# Patient Record
Sex: Female | Born: 1985 | Race: Black or African American | Hispanic: No | Marital: Married | Smoking: Never smoker
Health system: Southern US, Community
[De-identification: ages and names within clinical notes are randomized; demographics above are authoritative.]

## PROBLEM LIST (undated history)

## (undated) ENCOUNTER — Inpatient Hospital Stay (HOSPITAL_COMMUNITY): Payer: Self-pay

## (undated) DIAGNOSIS — Z789 Other specified health status: Secondary | ICD-10-CM

## (undated) HISTORY — PX: NO PAST SURGERIES: SHX2092

---
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
## 2008-04-27 ENCOUNTER — Emergency Department (HOSPITAL_COMMUNITY): Admission: EM | Admit: 2008-04-27 | Discharge: 2008-04-27 | Payer: Self-pay | Admitting: Emergency Medicine

## 2016-09-30 ENCOUNTER — Emergency Department (HOSPITAL_COMMUNITY): Payer: BC Managed Care – PPO

## 2016-09-30 ENCOUNTER — Encounter (HOSPITAL_COMMUNITY): Payer: Self-pay | Admitting: Emergency Medicine

## 2016-09-30 ENCOUNTER — Emergency Department (HOSPITAL_COMMUNITY)
Admission: EM | Admit: 2016-09-30 | Discharge: 2016-09-30 | Disposition: A | Discharge status: Home / Self Care | Payer: BC Managed Care – PPO | Attending: Emergency Medicine | Admitting: Emergency Medicine

## 2016-09-30 DIAGNOSIS — E876 Hypokalemia: Secondary | ICD-10-CM | POA: Diagnosis not present

## 2016-09-30 DIAGNOSIS — R112 Nausea with vomiting, unspecified: Secondary | ICD-10-CM

## 2016-09-30 DIAGNOSIS — K2970 Gastritis, unspecified, without bleeding: Secondary | ICD-10-CM | POA: Diagnosis not present

## 2016-09-30 DIAGNOSIS — R1033 Periumbilical pain: Secondary | ICD-10-CM | POA: Diagnosis present

## 2016-09-30 DIAGNOSIS — R740 Nonspecific elevation of levels of transaminase and lactic acid dehydrogenase [LDH]: Secondary | ICD-10-CM | POA: Diagnosis not present

## 2016-09-30 DIAGNOSIS — R1011 Right upper quadrant pain: Secondary | ICD-10-CM

## 2016-09-30 DIAGNOSIS — Z79899 Other long term (current) drug therapy: Secondary | ICD-10-CM | POA: Diagnosis not present

## 2016-09-30 DIAGNOSIS — R7401 Elevation of levels of liver transaminase levels: Secondary | ICD-10-CM

## 2016-09-30 LAB — URINALYSIS, ROUTINE W REFLEX MICROSCOPIC
BILIRUBIN URINE: NEGATIVE
Glucose, UA: NEGATIVE mg/dL
Hgb urine dipstick: NEGATIVE
Ketones, ur: NEGATIVE mg/dL
Leukocytes, UA: NEGATIVE
NITRITE: NEGATIVE
PH: 5 (ref 5.0–8.0)
Protein, ur: NEGATIVE mg/dL
SPECIFIC GRAVITY, URINE: 1.02 (ref 1.005–1.030)

## 2016-09-30 LAB — CBC WITH DIFFERENTIAL/PLATELET
Basophils Absolute: 0 10*3/uL (ref 0.0–0.1)
Basophils Relative: 0 %
Eosinophils Absolute: 0.1 10*3/uL (ref 0.0–0.7)
Eosinophils Relative: 1 %
HEMATOCRIT: 36.8 % (ref 36.0–46.0)
HEMOGLOBIN: 12.3 g/dL (ref 12.0–15.0)
LYMPHS ABS: 0.4 10*3/uL — AB (ref 0.7–4.0)
LYMPHS PCT: 6 %
MCH: 28.4 pg (ref 26.0–34.0)
MCHC: 33.4 g/dL (ref 30.0–36.0)
MCV: 85 fL (ref 78.0–100.0)
MONOS PCT: 5 %
Monocytes Absolute: 0.4 10*3/uL (ref 0.1–1.0)
NEUTROS ABS: 6.7 10*3/uL (ref 1.7–7.7)
NEUTROS PCT: 88 %
Platelets: 165 10*3/uL (ref 150–400)
RBC: 4.33 MIL/uL (ref 3.87–5.11)
RDW: 12.5 % (ref 11.5–15.5)
WBC: 7.6 10*3/uL (ref 4.0–10.5)

## 2016-09-30 LAB — COMPREHENSIVE METABOLIC PANEL
ALT: 23 U/L (ref 14–54)
AST: 44 U/L — AB (ref 15–41)
Albumin: 4 g/dL (ref 3.5–5.0)
Alkaline Phosphatase: 48 U/L (ref 38–126)
Anion gap: 6 (ref 5–15)
BUN: 10 mg/dL (ref 6–20)
CHLORIDE: 104 mmol/L (ref 101–111)
CO2: 26 mmol/L (ref 22–32)
CREATININE: 0.7 mg/dL (ref 0.44–1.00)
Calcium: 8.3 mg/dL — ABNORMAL LOW (ref 8.9–10.3)
GFR calc non Af Amer: >60 mL/min (ref >60)
Glucose, Bld: 107 mg/dL — ABNORMAL HIGH (ref 65–99)
Potassium: 3.3 mmol/L — ABNORMAL LOW (ref 3.5–5.1)
SODIUM: 136 mmol/L (ref 135–145)
Total Bilirubin: 0.9 mg/dL (ref 0.3–1.2)
Total Protein: 7.2 g/dL (ref 6.5–8.1)

## 2016-09-30 LAB — LIPASE, BLOOD: LIPASE: 16 U/L (ref 11–51)

## 2016-09-30 LAB — I-STAT CG4 LACTIC ACID, ED: Lactic Acid, Venous: 0.83 mmol/L (ref 0.5–1.9)

## 2016-09-30 MED ORDER — SODIUM CHLORIDE 0.9 % IV BOLUS (SEPSIS)
1000.0000 mL | Freq: Once | INTRAVENOUS | Status: AC
  Administered 2016-09-30: 1000 mL via INTRAVENOUS

## 2016-09-30 MED ORDER — FAMOTIDINE IN NACL 20-0.9 MG/50ML-% IV SOLN
20.0000 mg | Freq: Once | INTRAVENOUS | Status: AC
  Administered 2016-09-30: 20 mg via INTRAVENOUS
  Filled 2016-09-30: qty 50

## 2016-09-30 MED ORDER — RANITIDINE HCL 150 MG PO TABS: 150.0000 mg | ORAL_TABLET | Freq: Two times a day (BID) | ORAL | 0 refills | Status: DC

## 2016-09-30 MED ORDER — OXYCODONE-ACETAMINOPHEN 5-325 MG PO TABS
1.0000 | ORAL_TABLET | ORAL | Status: DC | PRN
  Administered 2016-09-30: 1 via ORAL
  Filled 2016-09-30: qty 1

## 2016-09-30 MED ORDER — ONDANSETRON 8 MG PO TBDP
8.0000 mg | ORAL_TABLET | Freq: Once | ORAL | Status: AC
  Administered 2016-09-30: 8 mg via ORAL
  Filled 2016-09-30: qty 1

## 2016-09-30 MED ORDER — ONDANSETRON 4 MG PO TBDP: 4.0000 mg | ORAL_TABLET | Freq: Three times a day (TID) | ORAL | 0 refills | Status: DC | PRN

## 2016-09-30 MED ORDER — POTASSIUM CHLORIDE CRYS ER 20 MEQ PO TBCR
40.0000 meq | EXTENDED_RELEASE_TABLET | Freq: Once | ORAL | Status: AC
  Administered 2016-09-30: 40 meq via ORAL
  Filled 2016-09-30: qty 2

## 2016-09-30 MED ORDER — MORPHINE SULFATE (PF) 4 MG/ML IV SOLN
4.0000 mg | Freq: Once | INTRAVENOUS | Status: AC
  Administered 2016-09-30: 4 mg via INTRAVENOUS
  Filled 2016-09-30: qty 1

## 2016-09-30 NOTE — ED Notes (Signed)
US at bedside

## 2016-09-30 NOTE — ED Notes (Signed)
Pt given urine cup and aware urine sample is needed 

## 2016-09-30 NOTE — Discharge Instructions (Signed)
Your abdominal pain is likely from gastritis or an ulcer, or just from stomach irritation from your antibiotics. Start taking zantac as directed, and avoid spicy/fatty/acidic foods, avoid soda/coffee/tea/alcohol. Avoid laying down flat within 30 minutes of eating. Avoid NSAIDs like ibuprofen/aleve/motrin/etc on an empty stomach. May consider using over the counter tums/maalox as needed for additional relief. Use zofran as directed as needed for nausea. Use tylenol as needed for pain. Follow up with your regular doctor in one week for recheck of symptoms. Return to the ER for changes or worsening symptoms.  Abdominal (belly) pain can be caused by many things. Your caregiver performed an examination and possibly ordered blood/urine tests and imaging (CT scan, x-rays, ultrasound). Many cases can be observed and treated at home after initial evaluation in the emergency department. Even though you are being discharged home, abdominal pain can be unpredictable. Therefore, you need a repeated exam if your pain does not resolve, returns, or worsens. Most patients with abdominal pain don't have to be admitted to the hospital or have surgery, but serious problems like appendicitis and gallbladder attacks can start out as nonspecific pain. Many abdominal conditions cannot be diagnosed in one visit, so follow-up evaluations are very important. SEEK IMMEDIATE MEDICAL ATTENTION IF YOU DEVELOP ANY OF THE FOLLOWING SYMPTOMS: The pain does not go away or becomes severe.  A temperature above 101 develops.  Repeated vomiting occurs (multiple episodes).  The pain becomes localized to portions of the abdomen. The right side could possibly be appendicitis. In an adult, the left lower portion of the abdomen could be colitis or diverticulitis.  Blood is being passed in stools or vomit (bright red or black tarry stools).  Return also if you develop chest pain, difficulty breathing, dizziness or fainting, or become confused, poorly  responsive, or inconsolable (young children). The constipation stays for more than 4 days.  There is belly (abdominal) or rectal pain.  You do not seem to be getting better.

## 2016-09-30 NOTE — ED Notes (Signed)
EDP at bedside  

## 2016-09-30 NOTE — ED Triage Notes (Signed)
Patient is complaining of central abdominal pain starting 1 hour ago and emesis yesterday. Denies n/v/d today.

## 2016-09-30 NOTE — ED Provider Notes (Signed)
[facility] DEPT Provider Note   CSN: [id number] Arrival date & time: 09/30/16  1501     History   Chief Complaint Chief Complaint  Patient presents with  . Abdominal Pain    HPI Dana Werner is a 31 y.o. Otherwise healthy female with no known PMHx, who presents to the ED with complaints of sudden onset periumbilical pain that began approximately 1 hour prior to arrival after she awoke from a nap. She describes the pain is 9/10 constant waxing and waning periumbilical stabbing pain that occasionally radiates to the epigastric area, worse with sitting up, improved with laying down, and with no other treatments tried prior to arrival. She states that 3 days ago she was diagnosed with strep, and she is currently on an antibiotic but she cannot recall the name of the antibiotic. She reports that yesterday she was outside in the heat and had 2 episodes of nonbloody nonbilious emesis associated with some nausea, but afterward she felt fine and was able to eat dinner without any difficulty, had no recurrent vomiting or nausea. Today she has not had any nausea or recurrent episodes of emesis. She used ibuprofen yesterday she does not chronically use NSAIDs. She does not have menstrual cycle because she has an IUD in place. She ate chicken soup at around 12:30 PM and that is the only thing she consumed today. Her PCP [facility]. She is sexually active with 1 female partner, protected with condoms. She denies fevers, chills, CP, SOB, recurrent/ongoing nausea/vomiting, diarrhea/constipation, obstipation, melena, hematochezia, hematemesis, hematuria, dysuria, flank pain, vaginal bleeding/discharge, myalgias, arthralgias, numbness, tingling, focal weakness, or any other complaints at this time. Denies recent travel, sick contacts, suspicious food intake, EtOH use, or prior abd surgeries.    The history is provided by the patient and medical records. No language interpreter was  used.  Abdominal Pain   This is a new problem. The current episode started 1 to 2 hours ago. The problem occurs constantly. The problem has not changed since onset.The pain is associated with an unknown factor. The pain is located in the periumbilical region. The quality of the pain is sharp (stabbing). The pain is at a severity of 9/10. The pain is moderate. Associated symptoms include vomiting (2x yesterday, then felt fine and able to eat, no ongoing vomiting). Pertinent negatives include fever, diarrhea, flatus, hematochezia, melena, nausea (none ongoing, one episode yesterday and then resolved entirely), constipation, dysuria, hematuria, arthralgias and myalgias. The symptoms are aggravated by certain positions. The symptoms are relieved by recumbency.    History reviewed. No pertinent past medical history.  There are no active problems to display for this patient.   History reviewed. No pertinent surgical history.  OB History    No data available       Home Medications    Prior to Admission medications   Medication Sig Start Date End Date Taking? Authorizing Provider  levonorgestrel (MIRENA) 20 MCG/24HR IUD 1 each by Intrauterine route once.   Yes Historical Provider, MD    Family History No family history on file.  Social History Social History  Substance Use Topics  . Smoking status: Never Smoker  . Smokeless tobacco: Never Used  . Alcohol use No     Allergies   Patient has no known allergies.   Review of Systems Review of Systems  Constitutional: Negative for chills and fever.  Respiratory: Negative for shortness of breath.   Cardiovascular: Negative for chest pain.  Gastrointestinal:  Positive for abdominal pain and vomiting (2x yesterday, then felt fine and able to eat, no ongoing vomiting). Negative for blood in stool, constipation, diarrhea, flatus, hematochezia, melena and nausea (none ongoing, one episode yesterday and then resolved entirely).    Genitourinary: Negative for dysuria, flank pain, hematuria, vaginal bleeding and vaginal discharge.  Musculoskeletal: Negative for arthralgias and myalgias.  Skin: Negative for color change.  Allergic/Immunologic: Negative for immunocompromised state.  Neurological: Negative for weakness and numbness.  Psychiatric/Behavioral: Negative for confusion.   All other systems reviewed and are negative for acute change except as noted in the HPI.    Physical Exam Updated Vital Signs BP (!) 124/94 (BP Location: Left Arm)   Pulse (!) 119   Temp 98.2 F (36.8 C) (Oral)   Resp 16   Ht 5' 4" (1.626 m)   Wt 56.7 kg   SpO2 100%   BMI 21.46 kg/m   Physical Exam  Constitutional: She is oriented to person, place, and time. Vital signs are normal. She appears well-developed and well-nourished.  Non-toxic appearance. No distress.  Afebrile, nontoxic, NAD  HENT:  Head: Normocephalic and atraumatic.  Mouth/Throat: Oropharynx is clear and moist. Mucous membranes are dry.  Mildly dry lips  Eyes: Conjunctivae and EOM are normal. Right eye exhibits no discharge. Left eye exhibits no discharge.  Neck: Normal range of motion. Neck supple.  Cardiovascular: Regular rhythm, normal heart sounds and intact distal pulses.  Tachycardia present.  Exam reveals no gallop and no friction rub.   No murmur heard. Mildly tachycardic likely from dehydration  Pulmonary/Chest: Effort normal and breath sounds normal. No respiratory distress. She has no decreased breath sounds. She has no wheezes. She has no rhonchi. She has no rales.  Abdominal: Soft. Normal appearance and bowel sounds are normal. She exhibits no distension. There is tenderness in the right upper quadrant and epigastric area. There is positive Murphy's sign. There is no rigidity, no rebound, no guarding, no CVA tenderness and no tenderness at McBurney's point.  Soft, nondistended, +BS throughout, with moderate RUQ and epigastric TTP, some milder RLQ  discomfort on deep palpation but pt denies that it's painful or really tender at all, no r/g/r, +murphy's, neg mcburney's, no CVA TTP, neg psoas sign and foot tap test  Musculoskeletal: Normal range of motion.  Neurological: She is alert and oriented to person, place, and time. She has normal strength. No sensory deficit.  Skin: Skin is warm, dry and intact. No rash noted.  Psychiatric: She has a normal mood and affect.  Nursing note and vitals reviewed.    ED Treatments / Results  Labs (all labs ordered are listed, but only abnormal results are displayed) Labs Reviewed  COMPREHENSIVE METABOLIC PANEL - Abnormal; Notable for the following:       Result Value   Potassium 3.3 (*)    Glucose, Bld 107 (*)    Calcium 8.3 (*)    AST 44 (*)    All other components within normal limits  CBC WITH DIFFERENTIAL/PLATELET - Abnormal; Notable for the following:    Lymphs Abs 0.4 (*)    All other components within normal limits  LIPASE, BLOOD  URINALYSIS, ROUTINE W REFLEX MICROSCOPIC  I-STAT BETA HCG BLOOD, ED ([facility], [facility], [facility] ONLY)  I-STAT CG4 LACTIC ACID, ED    EKG  EKG Interpretation None       Radiology US Abdomen Complete  Result Date: 09/30/2016 CLINICAL DATA:  Right upper quadrant pain EXAM: ABDOMEN ULTRASOUND COMPLETE COMPARISON:  None.  FINDINGS: Gallbladder: No gallstones or wall thickening visualized. No sonographic Murphy sign noted by sonographer. Common bile duct: Diameter: 3.4 mm Liver: No focal lesion identified. Within normal limits in parenchymal echogenicity. IVC: No abnormality visualized. Pancreas: Visualized portion unremarkable. Spleen: Size and appearance within normal limits. Right Kidney: Length: 10.1 cm. Echogenicity within normal limits. No mass or hydronephrosis visualized. Left Kidney: Length: 10.1 cm. Echogenicity within normal limits. No mass or hydronephrosis visualized. Abdominal aorta: No aneurysm visualized. Other findings: None. IMPRESSION: Negative abdominal  ultrasound. Electronically Signed   By: Franchot Gallo M.D.   On: 09/30/2016 17:50    Procedures Procedures (including critical care time)  Medications Ordered in ED Medications  oxyCODONE-acetaminophen (PERCOCET/ROXICET) 5-325 MG per tablet 1 tablet (1 tablet Oral Given 09/30/16 1514)  ondansetron (ZOFRAN-ODT) disintegrating tablet 8 mg (not administered)  sodium chloride 0.9 % bolus 1,000 mL (0 mLs Intravenous Stopped 09/30/16 1729)  morphine 4 MG/ML injection 4 mg (4 mg Intravenous Given 09/30/16 1602)  famotidine (PEPCID) IVPB 20 mg premix (0 mg Intravenous Stopped 09/30/16 1729)  potassium chloride SA (K-DUR,KLOR-CON) CR tablet 40 mEq (40 mEq Oral Given 09/30/16 1822)     Initial Impression / Assessment and Plan / ED Course  I have reviewed the triage vital signs and the nursing notes.  Pertinent labs & imaging results that were available during my care of the patient were reviewed by me and considered in my medical decision making (see chart for details).     31 y.o. female here with sudden onset periumbilical abd TTP, yesterday had 2 episodes of emesis but then ate well last night and felt fine, no ongoing n/v. Thinks n/v yesterday was because she was outside in the heat. On exam, mildly tachycardic, dry lips, moderate RUQ and epigastric TTP with +murphy's sign, some milder discomfort in RLQ but multiple repeat palpations did not reveal very significant tenderness there, neg foot tap test and psoas sign, nonperitoneal exam. Recent dx of strep, taking abx (unknown name) for this. Will get labs and U/S; considered CT over U/S but since pain is mostly in RUQ and +murphys, would rather go with U/S for better evaluation of the gallbladder/biliary tree; if RLQ tenderness becomes more intense and labs show leukocytosis, may need to consider getting CT, but will hold off for now. Doubt need for pelvic exam given lack of lower abd tenderness or vaginal complaints. Will give pain meds, fluids, and pepcid  then reassess shortly.   6:26 PM Pt feeling much better. Repeat abd exam reveals no ongoing abd tenderness, and no RLQ discomfort/tenderness at all. CBC w/diff unremarkable, which is reassuring. Unclear why she had a lactic done when this was not ordered, but lactic acid also WNL. CMP with mildly low K 3.3, will replete orally here; also with mildly elevated AST 44, no ALT or alk phos changes, bili WNL. Lipase WNL. BetaHCG not yet done, will have this done now. U/S completely unremarkable. Tachycardia improved with fluids. U/A not yet collected, will have this done now. Will reassess after U/A results.   8:31 PM Pt feeling well, mildly nauseated, will give ODT zofran before discharge. U/A unremarkable. BetaHCG neg (not crossing over, but confirmed that it is <5.0). Pt continues to feel improved. Symptoms likely related to gastritis or gastrointestinal irritation from strep throat/abx/NSAID use. Advised use of zantac, will rx zofran in case she gets nauseated again. Discussed lifestyle and diet modifications for gastritis. Tylenol PRN pain. F/up with PCP in 1wk for recheck of symptoms. I explained  the diagnosis and have given explicit precautions to return to the ER including for any other new or worsening symptoms. The patient understands and accepts the medical plan as it's been dictated and I have answered their questions. Discharge instructions concerning home care and prescriptions have been given. The patient is STABLE and is discharged to home in good condition.    Final Clinical Impressions(s) / ED Diagnoses   Final diagnoses:  RUQ abdominal pain  Hypokalemia  Elevated AST (SGOT)  Gastritis, presence of bleeding unspecified, unspecified chronicity, unspecified gastritis type  Nausea and vomiting in adult patient    New Prescriptions New Prescriptions   ONDANSETRON (ZOFRAN ODT) 4 MG DISINTEGRATING TABLET    Take 1 tablet (4 mg total) by mouth every 8 (eight) hours as needed for nausea or  vomiting.   RANITIDINE (ZANTAC) 150 MG TABLET    Take 1 tablet (150 mg total) by mouth 2 (two) times daily.     [address], PA-C 09/30/16 2032    Rex Kras Wenda Overland, MD 10/02/16 [phone]

## 2016-10-01 LAB — I-STAT BETA HCG BLOOD, ED (MC, WL, AP ONLY)

## 2017-05-31 NOTE — L&D Delivery Note (Signed)
Operative Delivery Note At 1:54 PM a viable female was delivered via Vaginal, Spontaneous.  Presentation: vertex; Position: Right,, Occiput,, Anterior; Station: +2.  Verbal consent: obtained from patient.  Risks and benefits discussed in detail.  Risks include, but are not limited to the risks of anesthesia, bleeding, infection, damage to maternal tissues, fetal cephalhematoma.  There is also the risk of inability to effect vaginal delivery of the head, or shoulder dystocia that cannot be resolved by established maneuvers, leading to the need for emergency cesarean section.  APGAR: 8, 9  weight pending   Placenta status:spontaneously with 3 vessel cord , .   Cord:  with the following complications: Short cord.  Cord pH: not obtained  Anesthesia:  epidural Instruments: Mushroom Episiotomy:  none Lacerations:  none Suture Repair: not applicable Est. Blood Loss (mL):    Mom to postpartum.  Baby to Couplet care / Skin to Skin.  [name] Natoya Viscomi 05/18/2018, 2:08 PM

## 2017-10-03 ENCOUNTER — Ambulatory Visit: Payer: Self-pay | Admitting: Podiatry

## 2017-10-26 LAB — OB RESULTS CONSOLE GC/CHLAMYDIA
Chlamydia: NEGATIVE
Gonorrhea: NEGATIVE

## 2017-10-26 LAB — OB RESULTS CONSOLE ABO/RH: RH TYPE: POSITIVE

## 2017-10-26 LAB — OB RESULTS CONSOLE RUBELLA ANTIBODY, IGM: Rubella: IMMUNE

## 2017-10-26 LAB — OB RESULTS CONSOLE HIV ANTIBODY (ROUTINE TESTING): HIV: NONREACTIVE

## 2017-10-26 LAB — OB RESULTS CONSOLE ANTIBODY SCREEN: Antibody Screen: NEGATIVE

## 2017-10-26 LAB — OB RESULTS CONSOLE HEPATITIS B SURFACE ANTIGEN: Hepatitis B Surface Ag: NEGATIVE

## 2017-10-26 LAB — OB RESULTS CONSOLE RPR: RPR: NONREACTIVE

## 2018-01-15 IMAGING — US US ABDOMEN COMPLETE
1 series · 14 of 25 positions shown · non-contrast
Comparison: None.

CLINICAL DATA: Right upper quadrant pain

EXAM:
ABDOMEN ULTRASOUND COMPLETE

[Series 1: us abdomen complete · 0.19mm/px · 14 of 97 slices shown]
[im 1/97]
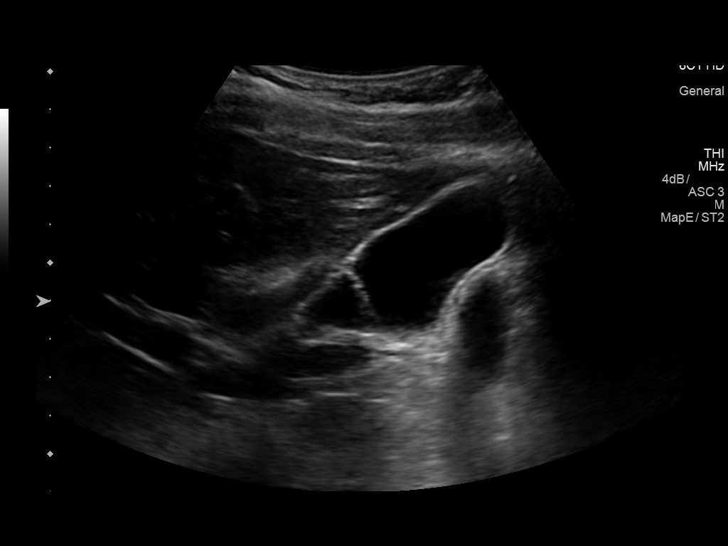
[im 9/97]
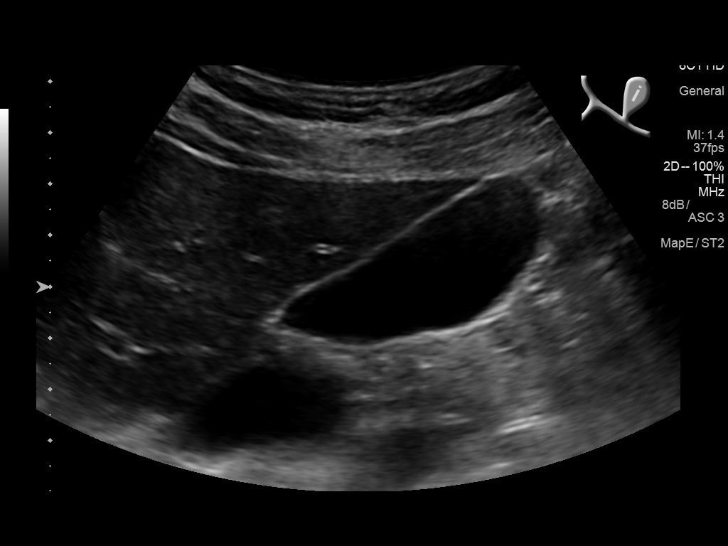
[im 17/97]
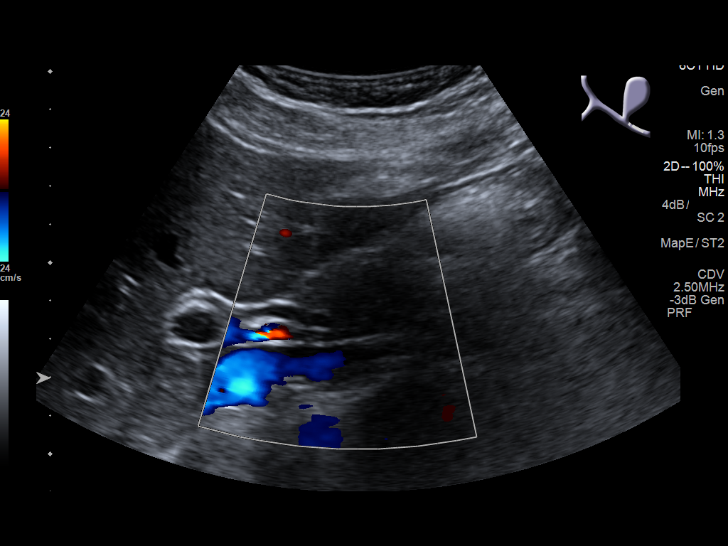
[im 25/97]
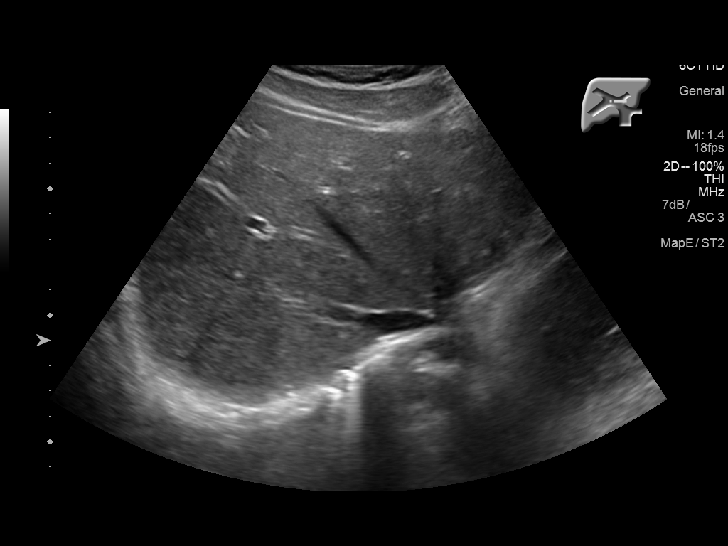
[im 33/97]
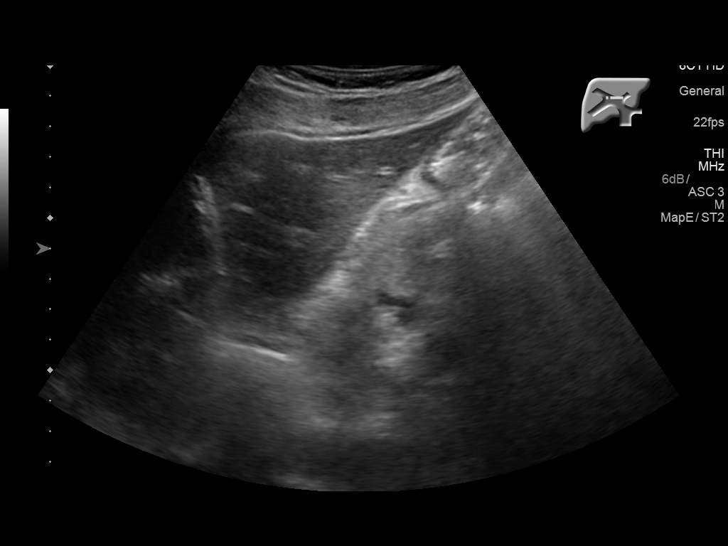
[im 37/97]
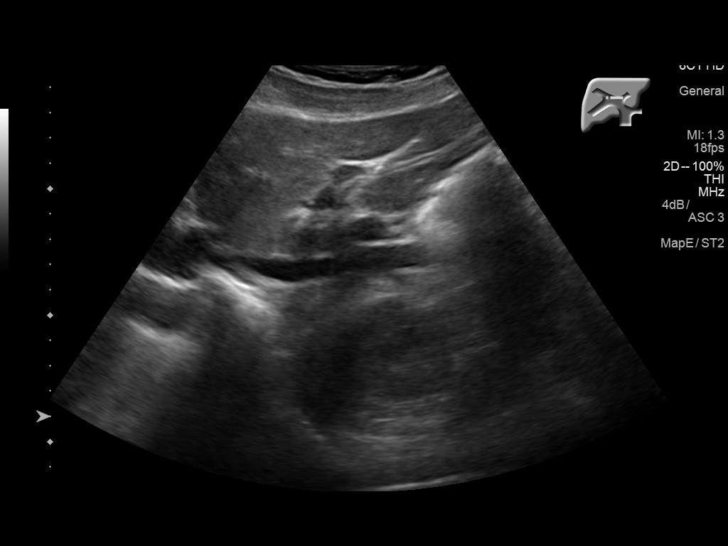
[im 45/97]
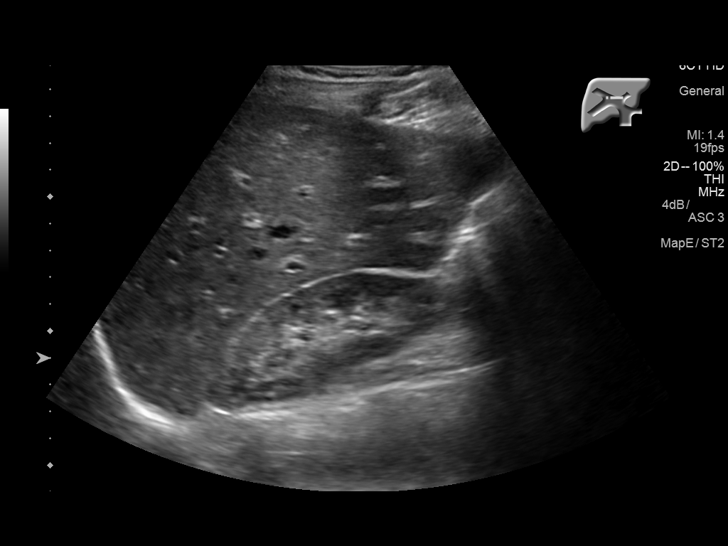
[im 53/97]
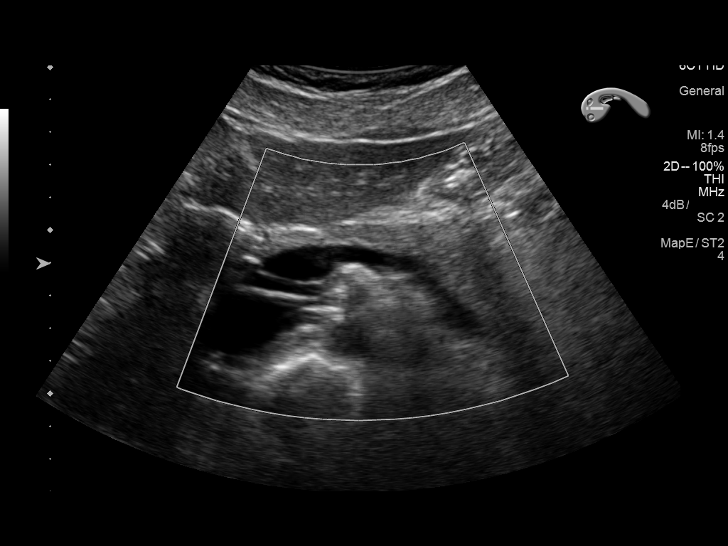
[im 61/97]
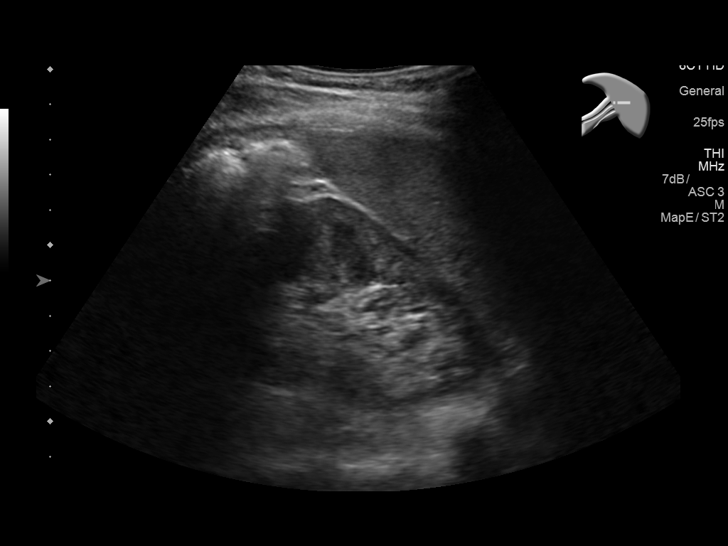
[im 65/97]
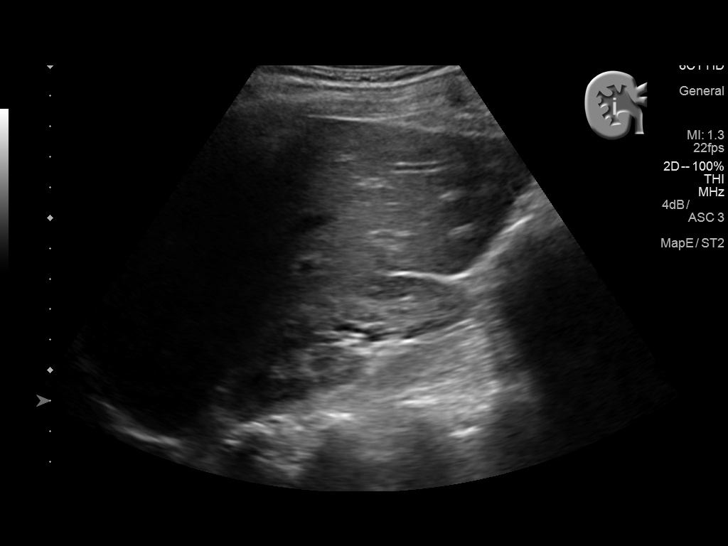
[im 73/97]
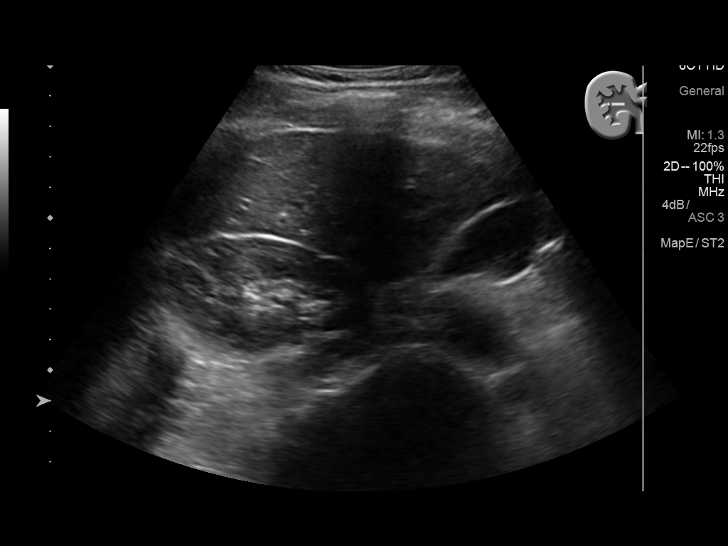
[im 81/97]
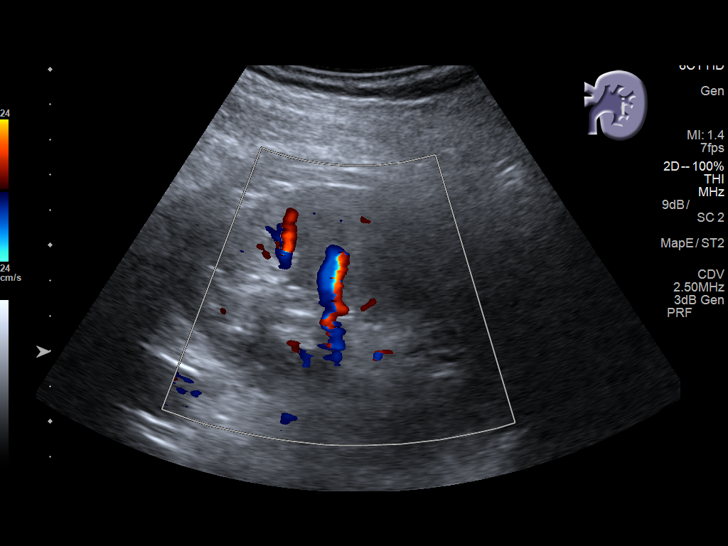
[im 89/97]
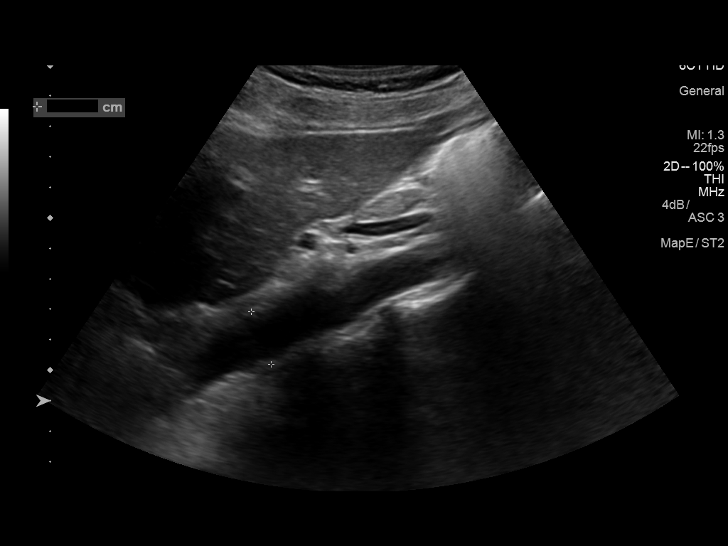
[im 97/97]
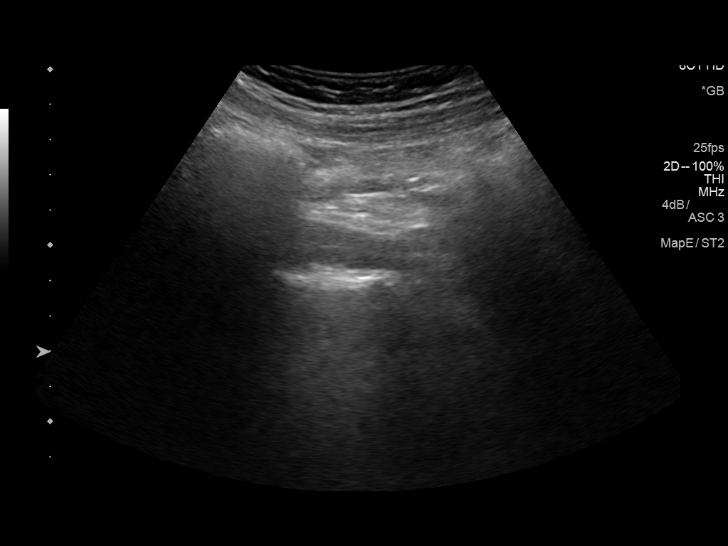

[14 of 25 positions shown; findings below may reference images not displayed]

FINDINGS: Gallbladder: No gallstones or wall thickening visualized. No
sonographic Murphy sign noted by sonographer.

Common bile duct: Diameter: 3.4 mm

Liver: No focal lesion identified. Within normal limits in
parenchymal echogenicity.

IVC: No abnormality visualized.

Pancreas: Visualized portion unremarkable.

Spleen: Size and appearance within normal limits.

Right Kidney: Length: 10.1 cm. Echogenicity within normal limits. No
mass or hydronephrosis visualized.

Left Kidney: Length: 10.1 cm. Echogenicity within normal limits. No
mass or hydronephrosis visualized.

Abdominal aorta: No aneurysm visualized.

Other findings: None.
IMPRESSION: Negative abdominal ultrasound.

## 2018-04-28 LAB — OB RESULTS CONSOLE GBS: STREP GROUP B AG: NEGATIVE

## 2018-05-08 ENCOUNTER — Other Ambulatory Visit: Payer: Self-pay

## 2018-05-08 ENCOUNTER — Inpatient Hospital Stay (HOSPITAL_COMMUNITY)
Admission: AD | Admit: 2018-05-08 | Discharge: 2018-05-08 | Disposition: A | Discharge status: Home / Self Care | Payer: BC Managed Care – PPO | Attending: Obstetrics and Gynecology | Admitting: Obstetrics and Gynecology

## 2018-05-08 ENCOUNTER — Inpatient Hospital Stay (HOSPITAL_BASED_OUTPATIENT_CLINIC_OR_DEPARTMENT_OTHER): Payer: BC Managed Care – PPO

## 2018-05-08 ENCOUNTER — Encounter (HOSPITAL_COMMUNITY): Payer: Self-pay | Admitting: *Deleted

## 2018-05-08 DIAGNOSIS — Z3689 Encounter for other specified antenatal screening: Secondary | ICD-10-CM

## 2018-05-08 DIAGNOSIS — Z3A37 37 weeks gestation of pregnancy: Secondary | ICD-10-CM | POA: Diagnosis not present

## 2018-05-08 DIAGNOSIS — Z6790 Unspecified blood type, Rh positive: Secondary | ICD-10-CM

## 2018-05-08 DIAGNOSIS — O4690 Antepartum hemorrhage, unspecified, unspecified trimester: Secondary | ICD-10-CM

## 2018-05-08 DIAGNOSIS — O403XX0 Polyhydramnios, third trimester, not applicable or unspecified: Secondary | ICD-10-CM | POA: Diagnosis not present

## 2018-05-08 DIAGNOSIS — O4693 Antepartum hemorrhage, unspecified, third trimester: Secondary | ICD-10-CM

## 2018-05-08 DIAGNOSIS — O26893 Other specified pregnancy related conditions, third trimester: Secondary | ICD-10-CM

## 2018-05-08 HISTORY — DX: Other specified health status: Z78.9

## 2018-05-08 LAB — CBC
HEMATOCRIT: 35.8 % — AB (ref 36.0–46.0)
Hemoglobin: 11.4 g/dL — ABNORMAL LOW (ref 12.0–15.0)
MCH: 26.8 pg (ref 26.0–34.0)
MCHC: 31.8 g/dL (ref 30.0–36.0)
MCV: 84 fL (ref 80.0–100.0)
NRBC: 0 % (ref 0.0–0.2)
Platelets: 196 10*3/uL (ref 150–400)
RBC: 4.26 MIL/uL (ref 3.87–5.11)
RDW: 13.4 % (ref 11.5–15.5)
WBC: 7.2 10*3/uL (ref 4.0–10.5)

## 2018-05-08 LAB — URINALYSIS, ROUTINE W REFLEX MICROSCOPIC
BILIRUBIN URINE: NEGATIVE
Bacteria, UA: NONE SEEN
Glucose, UA: NEGATIVE mg/dL
Ketones, ur: NEGATIVE mg/dL
LEUKOCYTES UA: NEGATIVE
Nitrite: NEGATIVE
PH: 6 (ref 5.0–8.0)
Protein, ur: NEGATIVE mg/dL
SPECIFIC GRAVITY, URINE: 1.013 (ref 1.005–1.030)

## 2018-05-08 LAB — ABO/RH: ABO/RH(D): O POS

## 2018-05-08 NOTE — MAU Provider Note (Signed)
History     CSN: [id number]  Arrival date and time: 05/08/18 [id number]   First Provider Initiated Contact with Patient 05/08/18 0932      Chief Complaint  Patient presents with  . Vaginal Bleeding   [id number] @37 .4 wks here with VB. Reports seeing dark red blood in her clothes last night. Bleeding slowed after but resumed this am. She did not use a pad but would if she had one. Reports good FM. No LOF. Having irregular BH ctx. No recent IC. Her pregnancy has been uncomplicated.   OB History    Gravida  5   Para  3   Term  3   Preterm      AB  1   Living  3     SAB      TAB  1   Ectopic      Multiple      Live Births  3           Past Medical History:  Diagnosis Date  . Medical history non-contributory     Past Surgical History:  Procedure Laterality Date  . NO PAST SURGERIES      History reviewed. No pertinent family history.  Social History   Tobacco Use  . Smoking status: Never Smoker  . Smokeless tobacco: Never Used  Substance Use Topics  . Alcohol use: No  . Drug use: No    Allergies: No Known Allergies  Medications Prior to Admission  Medication Sig Dispense Refill Last Dose  . acetaminophen (TYLENOL) 500 MG tablet Take 1,000 mg by mouth every 6 (six) hours as needed for headache.   05/07/2018 at Unknown time  . ondansetron (ZOFRAN ODT) 4 MG disintegrating tablet Take 1 tablet (4 mg total) by mouth every 8 (eight) hours as needed for nausea or vomiting. (Patient not taking: Reported on 05/08/2018) 15 tablet 0 Not Taking at Unknown time  . ranitidine (ZANTAC) 150 MG tablet Take 1 tablet (150 mg total) by mouth 2 (two) times daily. (Patient not taking: Reported on 05/08/2018) 30 tablet 0 Not Taking at Unknown time    Review of Systems  Gastrointestinal: Negative for abdominal pain.  Genitourinary: Positive for vaginal bleeding.   Physical Exam   Blood pressure 104/63, pulse (!) 108, temperature 98.5 F (36.9 C), temperature source Oral,  resp. rate 18, height 5\' 4"  (1.626 m), weight 73.8 kg, SpO2 97 %.  Physical Exam  Nursing note and vitals reviewed. Constitutional: She is oriented to person, place, and time. She appears well-developed and well-nourished. No distress.  HENT:  Head: Normocephalic and atraumatic.  Neck: Normal range of motion.  Respiratory: Effort normal. No respiratory distress.  GI: Soft. She exhibits no distension. There is no tenderness.  gravid  Genitourinary:  Genitourinary Comments: External: no lesions or erythema Vagina: rugated, pink, moist, scant drk bloody discharge Cervix 1/thick   Musculoskeletal: Normal range of motion.  Neurological: She is alert and oriented to person, place, and time.  Skin: Skin is warm and dry.  Psychiatric: She has a normal mood and affect.  EFM: 140 bpm, mod variability, + accels, no decels Toco: irregular with irritability  Results for orders placed or performed during the hospital encounter of 05/08/18 (from the past 24 hour(s))  Urinalysis, Routine w reflex microscopic     Status: Abnormal   Collection Time: 05/08/18  9:26 AM  Result Value Ref Range   Color, Urine YELLOW YELLOW   APPearance CLEAR CLEAR   Specific Gravity, Urine 1.013  1.005 - 1.030   pH 6.0 5.0 - 8.0   Glucose, UA NEGATIVE NEGATIVE mg/dL   Hgb urine dipstick LARGE (A) NEGATIVE   Bilirubin Urine NEGATIVE NEGATIVE   Ketones, ur NEGATIVE NEGATIVE mg/dL   Protein, ur NEGATIVE NEGATIVE mg/dL   Nitrite NEGATIVE NEGATIVE   Leukocytes, UA NEGATIVE NEGATIVE   RBC / HPF 0-5 0 - 5 RBC/hpf   WBC, UA 0-5 0 - 5 WBC/hpf   Bacteria, UA NONE SEEN NONE SEEN   Squamous Epithelial / LPF 0-5 0 - 5   Mucus PRESENT   CBC     Status: Abnormal   Collection Time: 05/08/18 10:25 AM  Result Value Ref Range   WBC 7.2 4.0 - 10.5 K/uL   RBC 4.26 3.87 - 5.11 MIL/uL   Hemoglobin 11.4 (L) 12.0 - 15.0 g/dL   HCT 45.4 (L) 09.8 - 11.9 %   MCV 84.0 80.0 - 100.0 fL   MCH 26.8 26.0 - 34.0 pg   MCHC 31.8 30.0 - 36.0  g/dL   RDW 14.7 82.9 - 56.2 %   Platelets 196 150 - 400 K/uL   nRBC 0.0 0.0 - 0.2 %  ABO/Rh     Status: None (Preliminary result)   Collection Time: 05/08/18 10:25 AM  Result Value Ref Range   ABO/RH(D)      O POS Performed at [facility], [address], [place], [place] [address]    [place]: prelim> mild poly, AFI 27 cm, cephalic, no evidence of previa or abruption  MAU Course  Procedures  MDM [place] ordered and reviewed>mild poly, AFI 27 cm, cephalic, no evidence of previa or abruption. No evidence of labor. No further VB while in MAU. Stable for discharge home.  Assessment and Plan   1. [redacted] weeks gestation of pregnancy   2. Vaginal bleeding in pregnancy   3. NST (non-stress test) reactive   4. Vaginal bleeding in pregnancy, third trimester   5. Blood type, Rh positive   6. Polyhydramnios in third trimester complication, single or unspecified fetus    Discharge home Follow up at [facility] tomorrow as scheduled Labor precautions Bleeding precaution FMCs  Allergies as of 05/08/2018   No Known Allergies     Medication List    STOP taking these medications   ondansetron 4 MG disintegrating tablet Commonly known as:  ZOFRAN-ODT   ranitidine 150 MG tablet Commonly known as:  ZANTAC     TAKE these medications   acetaminophen 500 MG tablet Commonly known as:  TYLENOL Take 1,000 mg by mouth every 6 (six) hours as needed for headache.      Donette Larry, CNM 05/08/2018, 1:07 PM

## 2018-05-08 NOTE — MAU Note (Signed)
Pt presents with c/o VB that began last night.  Reports VB is dark red, moderate amount.  Denies recent intercourse.  Reports +FM.

## 2018-05-08 NOTE — Discharge Instructions (Signed)
Fetal Movement Counts °Patient Name: ________________________________________________ Patient Due Date: ____________________ °What is a fetal movement count? °A fetal movement count is the number of times that you feel your baby move during a certain amount of time. This may also be called a fetal kick count. A fetal movement count is recommended for every pregnant woman. You may be asked to start counting fetal movements as early as week 28 of your pregnancy. °Pay attention to when your baby is most active. You may notice your baby's sleep and wake cycles. You may also notice things that make your baby move more. You should do a fetal movement count: °· When your baby is normally most active. °· At the same time each day. ° °A good time to count movements is while you are resting, after having something to eat and drink. °How do I count fetal movements? °1. Find a quiet, comfortable area. Sit, or lie down on your side. °2. Write down the date, the start time and stop time, and the number of movements that you felt between those two times. Take this information with you to your health care visits. °3. For 2 hours, count kicks, flutters, swishes, rolls, and jabs. You should feel at least 10 movements during 2 hours. °4. You may stop counting after you have felt 10 movements. °5. If you do not feel 10 movements in 2 hours, have something to eat and drink. Then, keep resting and counting for 1 hour. If you feel at least 4 movements during that hour, you may stop counting. °Contact a health care provider if: °· You feel fewer than 4 movements in 2 hours. °· Your baby is not moving like he or she usually does. °Date: ____________ Start time: ____________ Stop time: ____________ Movements: ____________ °Date: ____________ Start time: ____________ Stop time: ____________ Movements: ____________ °Date: ____________ Start time: ____________ Stop time: ____________ Movements: ____________ °Date: ____________ Start time:  ____________ Stop time: ____________ Movements: ____________ °Date: ____________ Start time: ____________ Stop time: ____________ Movements: ____________ °Date: ____________ Start time: ____________ Stop time: ____________ Movements: ____________ °Date: ____________ Start time: ____________ Stop time: ____________ Movements: ____________ °Date: ____________ Start time: ____________ Stop time: ____________ Movements: ____________ °Date: ____________ Start time: ____________ Stop time: ____________ Movements: ____________ °This information is not intended to replace advice given to you by your health care provider. Make sure you discuss any questions you have with your health care provider. °Document Released: 06/16/2006 Document Revised: 01/14/2016 Document Reviewed: 06/26/2015 °Elsevier Interactive Patient Education © 2018 [employer] °Vaginal Bleeding During Pregnancy, Third Trimester °A small amount of bleeding (spotting) from the vagina is relatively common in pregnancy. Various things can cause bleeding or spotting in pregnancy. Sometimes the bleeding is normal and is not a problem. However, bleeding during the third trimester can also be a sign of something serious for the mother and the baby. Be sure to tell your health care provider about any vaginal bleeding right away. °Some possible causes of vaginal bleeding during the third trimester include: °· The placenta may be partially or completely covering the opening to the cervix (placenta previa). °· The placenta may have separated from the uterus (abruption of the placenta). °· There may be an infection or growth on the cervix. °· You may be starting labor, called discharging of the mucus plug. °· The placenta may grow into the muscle layer of the uterus (placenta accreta). ° °Follow these instructions at home: °Watch your condition for any changes. The following actions may help to lessen   any discomfort you are feeling: °· Follow your health care  provider's instructions for limiting your activity. If your health care provider orders bed rest, you may need to stay in bed and only get up to use the bathroom. However, your health care provider may allow you to continue light activity. °· If needed, make plans for someone to help with your regular activities and responsibilities while you are on bed rest. °· Keep track of the number of pads you use each day, how often you change pads, and how soaked (saturated) they are. Write this down. °· Do not use tampons. Do not douche. °· Do not have sexual intercourse or orgasms until approved by your health care provider. °· Follow your health care provider's advice about lifting, driving, and physical activities. °· If you pass any tissue from your vagina, save the tissue so you can show it to your health care provider. °· Only take over-the-counter or prescription medicines as directed by your health care provider. °· Do not take aspirin because it can make you bleed. °· Keep all follow-up appointments as directed by your health care provider. ° °Contact a health care provider if: °· You have any vaginal bleeding during any part of your pregnancy. °· You have cramps or labor pains. °· You have a fever, not controlled by medicine. °Get help right away if: °· You have severe cramps or pain in your back or belly (abdomen). °· You have chills. °· You have a gush of fluid from the vagina. °· You pass large clots or tissue from your vagina. °· Your bleeding increases. °· You feel light-headed or weak. °· You pass out. °· You feel less movement or no movement of the baby. °This information is not intended to replace advice given to you by your health care provider. Make sure you discuss any questions you have with your health care provider. °Document Released: 08/07/2002 Document Revised: 10/23/2015 Document Reviewed: 01/22/2013 °Elsevier Interactive Patient Education © 2018 [employer] ° °

## 2018-05-09 ENCOUNTER — Telehealth (HOSPITAL_COMMUNITY): Payer: Self-pay | Admitting: *Deleted

## 2018-05-09 NOTE — Telephone Encounter (Signed)
Preadmission screen  

## 2018-05-18 ENCOUNTER — Inpatient Hospital Stay (HOSPITAL_COMMUNITY): Payer: BC Managed Care – PPO | Admitting: Anesthesiology

## 2018-05-18 ENCOUNTER — Encounter (HOSPITAL_COMMUNITY): Payer: Self-pay

## 2018-05-18 ENCOUNTER — Other Ambulatory Visit: Payer: Self-pay

## 2018-05-18 ENCOUNTER — Inpatient Hospital Stay (HOSPITAL_COMMUNITY)
Admission: RE | Admit: 2018-05-18 | Discharge: 2018-05-19 | DRG: 807 | Disposition: A | Discharge status: Home / Self Care | Payer: BC Managed Care – PPO | Attending: Obstetrics and Gynecology | Admitting: Obstetrics and Gynecology

## 2018-05-18 DIAGNOSIS — Z3483 Encounter for supervision of other normal pregnancy, third trimester: Secondary | ICD-10-CM | POA: Diagnosis present

## 2018-05-18 DIAGNOSIS — D649 Anemia, unspecified: Secondary | ICD-10-CM | POA: Diagnosis present

## 2018-05-18 DIAGNOSIS — O9902 Anemia complicating childbirth: Secondary | ICD-10-CM | POA: Diagnosis present

## 2018-05-18 DIAGNOSIS — Z3A39 39 weeks gestation of pregnancy: Secondary | ICD-10-CM | POA: Diagnosis not present

## 2018-05-18 DIAGNOSIS — Z3490 Encounter for supervision of normal pregnancy, unspecified, unspecified trimester: Secondary | ICD-10-CM

## 2018-05-18 LAB — RPR: RPR: NONREACTIVE

## 2018-05-18 LAB — TYPE AND SCREEN
ABO/RH(D): O POS
Antibody Screen: NEGATIVE

## 2018-05-18 LAB — CBC
HCT: 36.5 % (ref 36.0–46.0)
Hemoglobin: 11.8 g/dL — ABNORMAL LOW (ref 12.0–15.0)
MCH: 26.3 pg (ref 26.0–34.0)
MCHC: 32.3 g/dL (ref 30.0–36.0)
MCV: 81.5 fL (ref 80.0–100.0)
Platelets: 219 10*3/uL (ref 150–400)
RBC: 4.48 MIL/uL (ref 3.87–5.11)
RDW: 13.5 % (ref 11.5–15.5)
WBC: 6.7 10*3/uL (ref 4.0–10.5)
nRBC: 0 % (ref 0.0–0.2)

## 2018-05-18 MED ORDER — PHENYLEPHRINE 40 MCG/ML (10ML) SYRINGE FOR IV PUSH (FOR BLOOD PRESSURE SUPPORT)
80.0000 ug | PREFILLED_SYRINGE | INTRAVENOUS | Status: DC | PRN
  Administered 2018-05-18: 80 ug via INTRAVENOUS
  Filled 2018-05-18: qty 10

## 2018-05-18 MED ORDER — EPHEDRINE 5 MG/ML INJ
10.0000 mg | INTRAVENOUS | Status: DC | PRN
  Administered 2018-05-18: 10 mg via INTRAVENOUS
  Filled 2018-05-18: qty 2

## 2018-05-18 MED ORDER — ACETAMINOPHEN 325 MG PO TABS: 650.0000 mg | ORAL_TABLET | ORAL | Status: DC | PRN

## 2018-05-18 MED ORDER — OXYTOCIN 40 UNITS IN LACTATED RINGERS INFUSION - SIMPLE MED
2.5000 [IU]/h | INTRAVENOUS | Status: DC
  Filled 2018-05-18: qty 1000

## 2018-05-18 MED ORDER — ONDANSETRON HCL 4 MG PO TABS: 4.0000 mg | ORAL_TABLET | ORAL | Status: DC | PRN

## 2018-05-18 MED ORDER — LACTATED RINGERS IV SOLN: 500.0000 mL | Freq: Once | INTRAVENOUS | Status: DC

## 2018-05-18 MED ORDER — ONDANSETRON HCL 4 MG/2ML IJ SOLN: 4.0000 mg | INTRAMUSCULAR | Status: DC | PRN

## 2018-05-18 MED ORDER — DIPHENHYDRAMINE HCL 50 MG/ML IJ SOLN: 12.5000 mg | INTRAMUSCULAR | Status: DC | PRN

## 2018-05-18 MED ORDER — PHENYLEPHRINE 40 MCG/ML (10ML) SYRINGE FOR IV PUSH (FOR BLOOD PRESSURE SUPPORT)
80.0000 ug | PREFILLED_SYRINGE | INTRAVENOUS | Status: DC | PRN
  Filled 2018-05-18: qty 10

## 2018-05-18 MED ORDER — LIDOCAINE HCL (PF) 1 % IJ SOLN
30.0000 mL | INTRAMUSCULAR | Status: DC | PRN
  Filled 2018-05-18: qty 30

## 2018-05-18 MED ORDER — FLEET ENEMA 7-19 GM/118ML RE ENEM: 1.0000 | ENEMA | Freq: Every day | RECTAL | Status: DC | PRN

## 2018-05-18 MED ORDER — FENTANYL 2.5 MCG/ML BUPIVACAINE 1/10 % EPIDURAL INFUSION (WH - ANES)
14.0000 mL/h | INTRAMUSCULAR | Status: DC | PRN
  Administered 2018-05-18: 14 mL/h via EPIDURAL

## 2018-05-18 MED ORDER — OXYCODONE-ACETAMINOPHEN 5-325 MG PO TABS: 2.0000 | ORAL_TABLET | ORAL | Status: DC | PRN

## 2018-05-18 MED ORDER — COCONUT OIL OIL: 1.0000 "application " | TOPICAL_OIL | Status: DC | PRN

## 2018-05-18 MED ORDER — OXYCODONE-ACETAMINOPHEN 5-325 MG PO TABS: 1.0000 | ORAL_TABLET | ORAL | Status: DC | PRN

## 2018-05-18 MED ORDER — IBUPROFEN 600 MG PO TABS
600.0000 mg | ORAL_TABLET | Freq: Four times a day (QID) | ORAL | Status: DC
  Administered 2018-05-18 – 2018-05-19 (×5): 600 mg via ORAL
  Filled 2018-05-18 (×5): qty 1

## 2018-05-18 MED ORDER — ACETAMINOPHEN 325 MG PO TABS
650.0000 mg | ORAL_TABLET | ORAL | Status: DC | PRN
  Administered 2018-05-18 – 2018-05-19 (×2): 650 mg via ORAL
  Filled 2018-05-18 (×2): qty 2

## 2018-05-18 MED ORDER — TERBUTALINE SULFATE 1 MG/ML IJ SOLN
0.2500 mg | Freq: Once | INTRAMUSCULAR | Status: AC | PRN
  Administered 2018-05-18: 0.25 mg via SUBCUTANEOUS
  Filled 2018-05-18: qty 1

## 2018-05-18 MED ORDER — BISACODYL 10 MG RE SUPP: 10.0000 mg | Freq: Every day | RECTAL | Status: DC | PRN

## 2018-05-18 MED ORDER — OXYCODONE HCL 5 MG PO TABS: 10.0000 mg | ORAL_TABLET | ORAL | Status: DC | PRN

## 2018-05-18 MED ORDER — MEASLES, MUMPS & RUBELLA VAC IJ SOLR: 0.5000 mL | Freq: Once | INTRAMUSCULAR | Status: DC

## 2018-05-18 MED ORDER — ONDANSETRON HCL 4 MG/2ML IJ SOLN: 4.0000 mg | Freq: Four times a day (QID) | INTRAMUSCULAR | Status: DC | PRN

## 2018-05-18 MED ORDER — OXYTOCIN 40 UNITS IN LACTATED RINGERS INFUSION - SIMPLE MED
1.0000 m[IU]/min | INTRAVENOUS | Status: DC
  Administered 2018-05-18: 2 m[IU]/min via INTRAVENOUS

## 2018-05-18 MED ORDER — FENTANYL 2.5 MCG/ML BUPIVACAINE 1/10 % EPIDURAL INFUSION (WH - ANES)
INTRAMUSCULAR | Status: AC
  Filled 2018-05-18: qty 100

## 2018-05-18 MED ORDER — SOD CITRATE-CITRIC ACID 500-334 MG/5ML PO SOLN
30.0000 mL | ORAL | Status: DC | PRN
  Filled 2018-05-18: qty 15

## 2018-05-18 MED ORDER — MISOPROSTOL 25 MCG QUARTER TABLET
25.0000 ug | ORAL_TABLET | ORAL | Status: DC | PRN
  Administered 2018-05-18 (×2): 25 ug via VAGINAL
  Filled 2018-05-18 (×3): qty 1

## 2018-05-18 MED ORDER — WITCH HAZEL-GLYCERIN EX PADS
1.0000 "application " | MEDICATED_PAD | CUTANEOUS | Status: DC | PRN
  Administered 2018-05-18: 1 via TOPICAL

## 2018-05-18 MED ORDER — LACTATED RINGERS IV SOLN
500.0000 mL | INTRAVENOUS | Status: DC | PRN
  Administered 2018-05-18: 1000 mL via INTRAVENOUS

## 2018-05-18 MED ORDER — LACTATED RINGERS IV SOLN
INTRAVENOUS | Status: DC
  Administered 2018-05-18 (×2): via INTRAVENOUS

## 2018-05-18 MED ORDER — LIDOCAINE HCL (PF) 1 % IJ SOLN
INTRAMUSCULAR | Status: DC | PRN
  Administered 2018-05-18 (×2): 5 mL via EPIDURAL

## 2018-05-18 MED ORDER — MEDROXYPROGESTERONE ACETATE 150 MG/ML IM SUSP: 150.0000 mg | INTRAMUSCULAR | Status: DC | PRN

## 2018-05-18 MED ORDER — SIMETHICONE 80 MG PO CHEW: 80.0000 mg | CHEWABLE_TABLET | ORAL | Status: DC | PRN

## 2018-05-18 MED ORDER — BENZOCAINE-MENTHOL 20-0.5 % EX AERO
1.0000 "application " | INHALATION_SPRAY | CUTANEOUS | Status: DC | PRN
  Administered 2018-05-18: 1 via TOPICAL
  Filled 2018-05-18: qty 56

## 2018-05-18 MED ORDER — DIBUCAINE 1 % RE OINT
1.0000 "application " | TOPICAL_OINTMENT | RECTAL | Status: DC | PRN
  Administered 2018-05-18: 1 via RECTAL
  Filled 2018-05-18: qty 28

## 2018-05-18 MED ORDER — ZOLPIDEM TARTRATE 5 MG PO TABS: 5.0000 mg | ORAL_TABLET | Freq: Every evening | ORAL | Status: DC | PRN

## 2018-05-18 MED ORDER — EPHEDRINE 5 MG/ML INJ
10.0000 mg | INTRAVENOUS | Status: DC | PRN
  Filled 2018-05-18: qty 2
  Filled 2018-05-18: qty 4

## 2018-05-18 MED ORDER — DIPHENHYDRAMINE HCL 25 MG PO CAPS: 25.0000 mg | ORAL_CAPSULE | Freq: Four times a day (QID) | ORAL | Status: DC | PRN

## 2018-05-18 MED ORDER — PHENYLEPHRINE 40 MCG/ML (10ML) SYRINGE FOR IV PUSH (FOR BLOOD PRESSURE SUPPORT)
PREFILLED_SYRINGE | INTRAVENOUS | Status: AC
  Filled 2018-05-18: qty 10

## 2018-05-18 MED ORDER — PRENATAL MULTIVITAMIN CH
1.0000 | ORAL_TABLET | Freq: Every day | ORAL | Status: DC
  Administered 2018-05-19: 1 via ORAL
  Filled 2018-05-18: qty 1

## 2018-05-18 MED ORDER — SENNOSIDES-DOCUSATE SODIUM 8.6-50 MG PO TABS
2.0000 | ORAL_TABLET | ORAL | Status: DC
  Administered 2018-05-19: 2 via ORAL
  Filled 2018-05-18: qty 2

## 2018-05-18 MED ORDER — OXYCODONE HCL 5 MG PO TABS: 5.0000 mg | ORAL_TABLET | ORAL | Status: DC | PRN

## 2018-05-18 MED ORDER — OXYTOCIN BOLUS FROM INFUSION
500.0000 mL | Freq: Once | INTRAVENOUS | Status: AC
  Administered 2018-05-18: 500 mL via INTRAVENOUS

## 2018-05-18 MED ORDER — TETANUS-DIPHTH-ACELL PERTUSSIS 5-2.5-18.5 LF-MCG/0.5 IM SUSP: 0.5000 mL | Freq: Once | INTRAMUSCULAR | Status: DC

## 2018-05-18 NOTE — H&P (Signed)
Dana Werner is a 32 y.o.G 5 P 3013 at 39 weeks presents for IOL secondary to patient request. OB History    Gravida  5   Para  3   Term  3   Preterm      AB  1   Living  3     SAB      TAB  1   Ectopic      Multiple      Live Births  3          Past Medical History:  Diagnosis Date  . Medical history non-contributory    Past Surgical History:  Procedure Laterality Date  . NO PAST SURGERIES     Family History: family history is not on file. Social History:  reports that she has never smoked. She has never used smokeless tobacco. She reports that she does not drink alcohol or use drugs.     Maternal Diabetes: No Genetic Screening: Normal Maternal Ultrasounds/Referrals: Normal Fetal Ultrasounds or other Referrals:  None Maternal Substance Abuse:  No Significant Maternal Medications:  None Significant Maternal Lab Results:  None Other Comments:  None  Review of Systems  All other systems reviewed and are negative.  History Dilation: 1 Effacement (%): 60 Station: -3 Exam by:: Dana Matteina Jacobs, RN Blood pressure 102/64, pulse 75, temperature 98.5 F (36.9 C), temperature source Axillary, resp. rate 14, height 5\' 4"  (1.626 m), weight 75.6 kg.   Fetal Exam Fetal State Assessment: Category I - tracings are normal.     Physical Exam  Nursing note and vitals reviewed. Constitutional: She appears well-developed.  HENT:  Head: Normocephalic.  Eyes: Pupils are equal, round, and reactive to light.  Neck: Normal range of motion.  Cardiovascular: Normal rate and regular rhythm.  Respiratory: Effort normal.  GI: Soft.    Prenatal labs: ABO, Rh: --/--/O POS (12/19 0150) Antibody: NEG (12/19 0150) Rubella: Immune (05/29 0000) RPR: Nonreactive (05/29 0000)  HBsAg: Negative (05/29 0000)  HIV: Non-reactive (05/29 0000)  GBS: Negative (11/29 0000)   Assessment/Plan: IUP at term IOL Pitocin prn Epidural prn Anticipate NSVD   Dana Werner  Dana Werner 05/18/2018, 8:04 AM

## 2018-05-18 NOTE — Anesthesia Postprocedure Evaluation (Signed)
Anesthesia Post Note  Patient: Dana Werner  Procedure(s) Performed: AN AD HOC LABOR EPIDURAL     Patient location during evaluation: Mother Baby Anesthesia Type: Epidural Level of consciousness: awake and alert Pain management: pain level controlled Vital Signs Assessment: post-procedure vital signs reviewed and stable Respiratory status: spontaneous breathing, nonlabored ventilation and respiratory function stable Cardiovascular status: stable Postop Assessment: no headache, no backache, epidural receding, able to ambulate, adequate PO intake, no apparent nausea or vomiting and patient able to bend at knees Anesthetic complications: no    Last Vitals:  Vitals:   05/18/18 1535 05/18/18 1637  BP: 117/67 115/70  Pulse: 98 (!) 102  Resp: 17 18  Temp: 37.4 C 37.1 C  SpO2:      Last Pain:  Vitals:   05/18/18 1637  TempSrc: Oral  PainSc:    Pain Goal:                 Land O'LakesMalinova,Koren Plyler Hristova

## 2018-05-18 NOTE — Anesthesia Pain Management Evaluation Note (Signed)
  [facility] Pain Management Visit Note  Patient: Dana Werner, 32 y.o., female  "Hello [facility] am a [facility]. We have an anesthesia team available at all times to provide care throughout the hospital, including epidural management and anesthesia for C-section. I don't know your plan for the delivery whether it a natural birth, water birth, IV sedation, nitrous supplementation, doula or epidural, but we want to meet your pain goals."   1.Was your pain managed to your expectations on prior hospitalizations?   No prior hospitalizations  2.What is your expectation for pain management during this hospitalization?     Epidural  3.How can we help you reach that goal?   Record the patient's initial score and the patient's pain goal.   Pain: 5  Pain Goal: 7 [facility] wants you to be able to say your pain was always managed very well.  Laban EmperorMalinova,Markea Ruzich Hristova 05/18/2018

## 2018-05-18 NOTE — Anesthesia Procedure Notes (Signed)
Epidural Patient location during procedure: OB Start time: 05/18/2018 8:44 AM End time: 05/18/2018 8:54 AM  Staffing Anesthesiologist: Leonides GrillsEllender, Ryan P, MD Performed: anesthesiologist   Preanesthetic Checklist Completed: patient identified, site marked, pre-op evaluation, timeout performed, IV checked, risks and benefits discussed and monitors and equipment checked  Epidural Patient position: sitting Prep: DuraPrep Patient monitoring: heart rate, cardiac monitor, continuous pulse ox and blood pressure Approach: midline Location: L4-L5 Injection technique: LOR air  Needle:  Needle type: Tuohy  Needle gauge: 17 G Needle length: 9 cm Needle insertion depth: 6 cm Catheter type: closed end flexible Catheter size: 19 Gauge Catheter at skin depth: 11 cm Test dose: negative and Other  Assessment Events: blood not aspirated, injection not painful, no injection resistance and negative IV test  Additional Notes Informed consent obtained prior to proceeding including risk of failure, 1% risk of PDPH, risk of minor discomfort and bruising. Discussed alternatives to epidural analgesia and patient desires to proceed.  Timeout performed pre-procedure verifying patient name, procedure, and platelet count.  Patient tolerated procedure well. Reason for block:procedure for pain

## 2018-05-18 NOTE — Anesthesia Preprocedure Evaluation (Signed)
Anesthesia Evaluation  Patient identified by MRN, date of birth, ID band Patient awake    Reviewed: Allergy & Precautions, H&P , NPO status , Patient's Chart, lab work & pertinent test results  History of Anesthesia Complications Negative for: history of anesthetic complications  Airway Mallampati: II  TM Distance: >3 FB Neck ROM: full    Dental no notable dental hx. (+) Teeth Intact   Pulmonary neg pulmonary ROS,    Pulmonary exam normal breath sounds clear to auscultation       Cardiovascular negative cardio ROS Normal cardiovascular exam Rhythm:regular Rate:Normal     Neuro/Psych negative neurological ROS  negative psych ROS   GI/Hepatic negative GI ROS, Neg liver ROS,   Endo/Other  negative endocrine ROS  Renal/GU negative Renal ROS  negative genitourinary   Musculoskeletal   Abdominal   Peds  Hematology  (+) Blood dyscrasia, anemia ,   Anesthesia Other Findings   Reproductive/Obstetrics (+) Pregnancy                             Anesthesia Physical Anesthesia Plan  ASA: II  Anesthesia Plan: Epidural   Post-op Pain Management:    Induction:   PONV Risk Score and Plan:   Airway Management Planned:   Additional Equipment:   Intra-op Plan:   Post-operative Plan:   Informed Consent: I have reviewed the patients History and Physical, chart, labs and discussed the procedure including the risks, benefits and alternatives for the proposed anesthesia with the patient or authorized representative who has indicated his/her understanding and acceptance.       Plan Discussed with:   Anesthesia Plan Comments:         Anesthesia Quick Evaluation  

## 2018-05-19 LAB — CBC
HCT: 30.4 % — ABNORMAL LOW (ref 36.0–46.0)
Hemoglobin: 9.7 g/dL — ABNORMAL LOW (ref 12.0–15.0)
MCH: 26.2 pg (ref 26.0–34.0)
MCHC: 31.9 g/dL (ref 30.0–36.0)
MCV: 82.2 fL (ref 80.0–100.0)
Platelets: 169 10*3/uL (ref 150–400)
RBC: 3.7 MIL/uL — AB (ref 3.87–5.11)
RDW: 13.5 % (ref 11.5–15.5)
WBC: 12.8 10*3/uL — ABNORMAL HIGH (ref 4.0–10.5)
nRBC: 0 % (ref 0.0–0.2)

## 2018-05-19 LAB — RUBELLA SCREEN: Rubella: 2.02 index (ref >0.99)

## 2018-05-19 MED ORDER — IBUPROFEN 600 MG PO TABS: 600.0000 mg | ORAL_TABLET | Freq: Four times a day (QID) | ORAL | 0 refills | Status: AC

## 2018-05-19 MED ORDER — WITCH HAZEL-GLYCERIN EX PADS: 1.0000 "application " | MEDICATED_PAD | CUTANEOUS | 12 refills | Status: AC | PRN

## 2018-05-19 MED ORDER — DIBUCAINE 1 % RE OINT: 1.0000 "application " | TOPICAL_OINTMENT | RECTAL | 0 refills | Status: AC | PRN

## 2018-05-19 NOTE — Progress Notes (Signed)
Post Partum Day 1 Subjective: no complaints, up ad lib, voiding and tolerating PO.  Patient requests early discharge and circ.  Objective: Blood pressure 102/66, pulse 78, temperature 98 F (36.7 C), resp. rate 16, height 5\' 4"  (1.626 m), weight 75.6 kg, SpO2 99 %, unknown if currently breastfeeding.  Physical Exam:  General: alert, cooperative and appears stated age Lochia: appropriate Uterine Fundus: firm Incision: healing well, no significant drainage, no dehiscence DVT Evaluation: No evidence of DVT seen on physical exam. Negative Homan's sign. No cords or calf tenderness.  Recent Labs    05/18/18 0202 05/19/18 0619  HGB 11.8* 9.7*  HCT 36.5 30.4*    Assessment/Plan: Discharge home and Circumcision prior to discharge  Circ-counseled re: risk of bleeding, infection, and scarring.  All questions were answered and we will proceed.   LOS: 1 day   Mitchel HonourMegan Dashanae Longfield 05/19/2018, 8:42 AM

## 2018-05-19 NOTE — Discharge Instructions (Signed)
Call MD for T>100.4, heavy vaginal bleeding, severe abdominal pain, or respiratory distress.  Call office to schedule postpartum visit in 6 weeks.  Pelvic rest x 6 weeks.   

## 2018-05-19 NOTE — Discharge Summary (Signed)
Obstetric Discharge Summary Reason for Admission: induction of labor Prenatal Procedures: none Intrapartum Procedures: spontaneous vaginal delivery and vacuum Postpartum Procedures: none Complications-Operative and Postpartum: none Hemoglobin  Date Value Ref Range Status  05/19/2018 9.7 (L) 12.0 - 15.0 g/dL Final   HCT  Date Value Ref Range Status  05/19/2018 30.4 (L) 36.0 - 46.0 % Final    Physical Exam:  General: alert, cooperative and appears stated age 67Lochia: appropriate Uterine Fundus: firm Incision: n/a DVT Evaluation: No evidence of DVT seen on physical exam. Negative Homan's sign. No cords or calf tenderness.  Discharge Diagnoses: Term Pregnancy-delivered  Discharge Information: Date: 05/19/2018 Activity: pelvic rest Diet: routine Medications: PNV and Ibuprofen Condition: stable Instructions: refer to practice specific booklet Discharge to: home   Newborn Data: Live born female  Birth Weight: 8 lb 2.2 oz (3690 g) APGAR: 8, 9  Newborn Delivery   Birth date/time:  05/18/2018 13:54:00 Delivery type:  Vaginal, Spontaneous     Home with mother.  Mitchel HonourMegan Georgia Delsignore 05/19/2018, 8:48 AM
